# Patient Record
Sex: Male | Born: 1960 | Race: White | Hispanic: No | Marital: Single | Smoking: Former smoker
Health system: Southern US, Community
[De-identification: ages and names within clinical notes are randomized; demographics above are authoritative.]

## PROBLEM LIST (undated history)

## (undated) DIAGNOSIS — M439 Deforming dorsopathy, unspecified: Secondary | ICD-10-CM

## (undated) DIAGNOSIS — K7460 Unspecified cirrhosis of liver: Secondary | ICD-10-CM

## (undated) DIAGNOSIS — F32A Depression, unspecified: Secondary | ICD-10-CM

## (undated) DIAGNOSIS — E7800 Pure hypercholesterolemia, unspecified: Secondary | ICD-10-CM

## (undated) DIAGNOSIS — F329 Major depressive disorder, single episode, unspecified: Secondary | ICD-10-CM

---
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
## 2005-01-03 ENCOUNTER — Emergency Department: Payer: Self-pay | Admitting: Emergency Medicine

## 2006-08-30 ENCOUNTER — Emergency Department: Payer: Self-pay | Admitting: Emergency Medicine

## 2006-08-30 ENCOUNTER — Other Ambulatory Visit: Payer: Self-pay

## 2008-04-22 ENCOUNTER — Emergency Department: Payer: Self-pay | Admitting: Emergency Medicine

## 2009-05-29 ENCOUNTER — Emergency Department: Payer: Self-pay | Admitting: Emergency Medicine

## 2009-07-17 ENCOUNTER — Emergency Department: Payer: Self-pay | Admitting: Emergency Medicine

## 2011-06-03 ENCOUNTER — Emergency Department: Payer: Self-pay | Admitting: *Deleted

## 2012-09-14 LAB — URINALYSIS, COMPLETE
Bilirubin,UR: NEGATIVE
Ketone: NEGATIVE
Ph: 6 (ref 4.5–8.0)
Protein: NEGATIVE

## 2012-09-14 LAB — CK TOTAL AND CKMB (NOT AT ARMC)
CK, Total: 161 U/L (ref 35–232)
CK-MB: 10.2 ng/mL — ABNORMAL HIGH (ref 0.5–3.6)

## 2012-09-14 LAB — COMPREHENSIVE METABOLIC PANEL
Alkaline Phosphatase: 61 U/L (ref 50–136)
BUN: 10 mg/dL (ref 7–18)
Creatinine: 0.79 mg/dL (ref 0.60–1.30)
EGFR (African American): >60
EGFR (Non-African Amer.): >60
Glucose: 93 mg/dL (ref 65–99)
Osmolality: 278 (ref 275–301)
Potassium: 3.5 mmol/L (ref 3.5–5.1)
SGOT(AST): 60 U/L — ABNORMAL HIGH (ref 15–37)
SGPT (ALT): 82 U/L — ABNORMAL HIGH (ref 12–78)
Sodium: 140 mmol/L (ref 136–145)

## 2012-09-14 LAB — ETHANOL: Ethanol %: 0.098 % — ABNORMAL HIGH (ref 0.000–0.080)

## 2012-09-14 LAB — DRUG SCREEN, URINE
Amphetamines, Ur Screen: NEGATIVE (ref ?–1000)
Benzodiazepine, Ur Scrn: POSITIVE (ref ?–200)
MDMA (Ecstasy)Ur Screen: NEGATIVE (ref ?–500)
Methadone, Ur Screen: NEGATIVE (ref ?–300)
Opiate, Ur Screen: NEGATIVE (ref ?–300)
Phencyclidine (PCP) Ur S: NEGATIVE (ref ?–25)
Tricyclic, Ur Screen: POSITIVE (ref ?–1000)

## 2012-09-14 LAB — CBC WITH DIFFERENTIAL/PLATELET
Basophil #: 0.1 x10 3/mm 3 (ref 0.0–0.1)
Basophil %: 1 %
Eosinophil #: 0.1 x10 3/mm 3 (ref 0.0–0.7)
Eosinophil %: 2.6 %
HCT: 48.1 % (ref 40.0–52.0)
HGB: 16.4 g/dL (ref 13.0–18.0)
Lymphocyte %: 18.3 %
Lymphs Abs: 1 x10 3/mm 3 (ref 1.0–3.6)
MCH: 32 pg (ref 26.0–34.0)
MCHC: 34 g/dL (ref 32.0–36.0)
MCV: 94 fL (ref 80–100)
Monocyte #: 0.4 x10 3/mm (ref 0.2–1.0)
Monocyte %: 7.3 %
Neutrophil #: 4 x10 3/mm 3 (ref 1.4–6.5)
Neutrophil %: 70.8 %
Platelet: 161 x10 3/mm 3 (ref 150–440)
RBC: 5.11 x10 6/mm 3 (ref 4.40–5.90)
RDW: 13.8 % (ref 11.5–14.5)
WBC: 5.7 x10 3/mm 3 (ref 3.8–10.6)

## 2012-09-14 LAB — TROPONIN I: Troponin-I: 0.03 ng/mL

## 2012-09-14 LAB — PROTIME-INR: Prothrombin Time: 13.8 secs (ref 11.5–14.7)

## 2012-09-14 LAB — ACETAMINOPHEN LEVEL: Acetaminophen: <2 ug/mL

## 2012-09-14 LAB — APTT: Activated PTT: 28.9 s (ref 23.6–35.9)

## 2012-09-14 LAB — SALICYLATE LEVEL: Salicylates, Serum: 2.4 mg/dL

## 2012-09-15 ENCOUNTER — Observation Stay: Payer: Self-pay | Admitting: Psychiatry

## 2012-09-15 LAB — TROPONIN I: Troponin-I: <0.02 ng/mL

## 2012-09-16 ENCOUNTER — Inpatient Hospital Stay: Payer: Self-pay | Admitting: Psychiatry

## 2013-04-12 IMAGING — US US EXTREM LOW VENOUS*R*
1 series · 17 of 24 positions shown · non-contrast
Comparison: none

REASON FOR EXAM: pain/redness
COMMENTS:

[Series 1: us extrem low venous*right* · 17 of 26 slices shown]
[im 1/26]
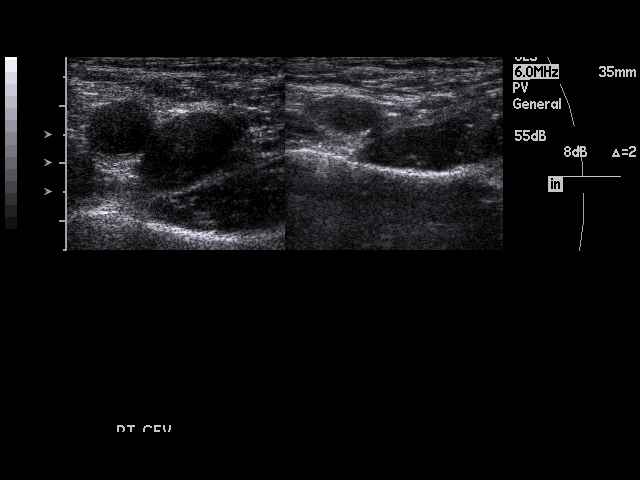
[im 3/26]
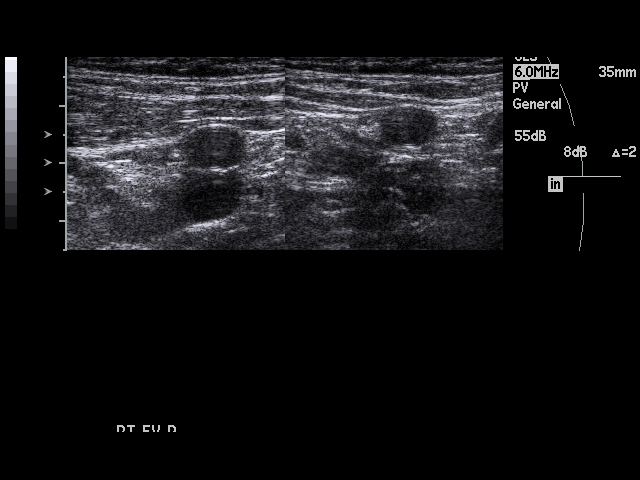
[im 4/26]
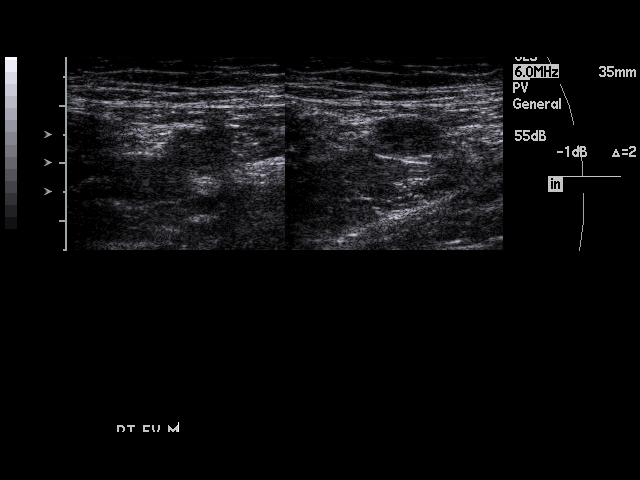
[im 5/26]
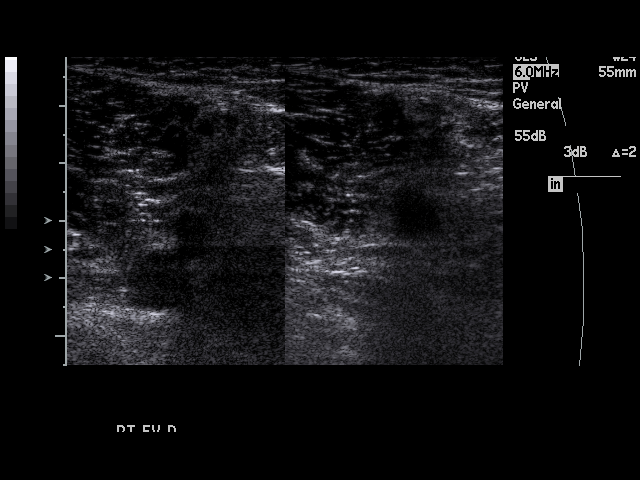
[im 7/26]
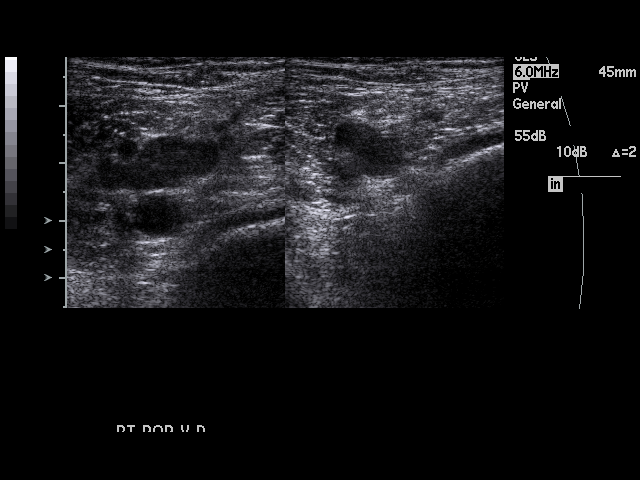
[im 8/26]
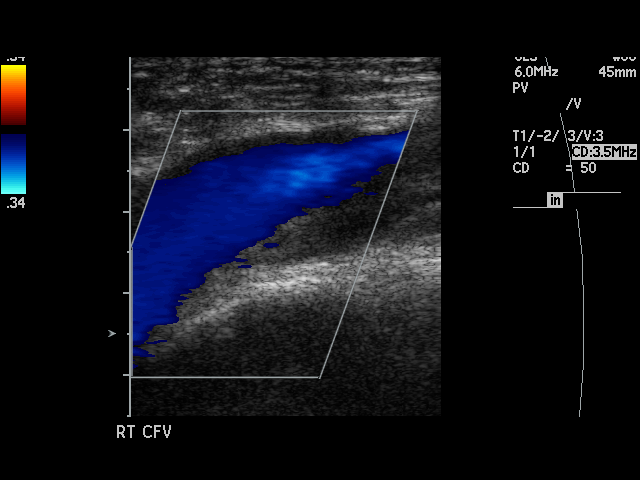
[im 10/26]
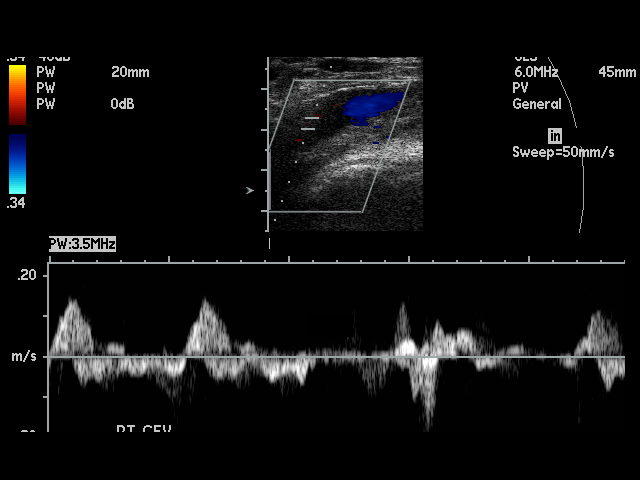
[im 11/26]
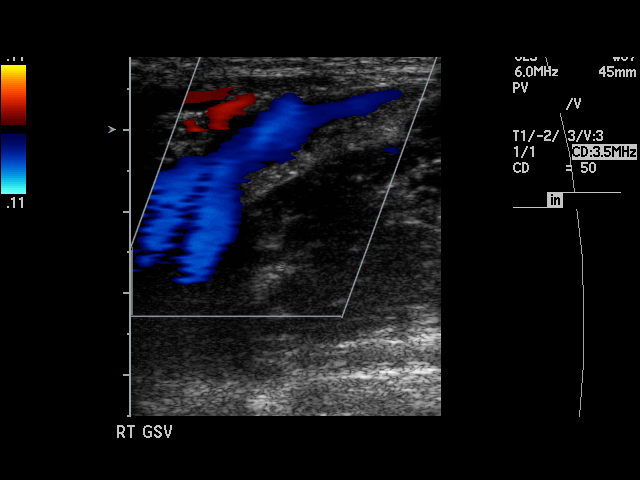
[im 14/26]
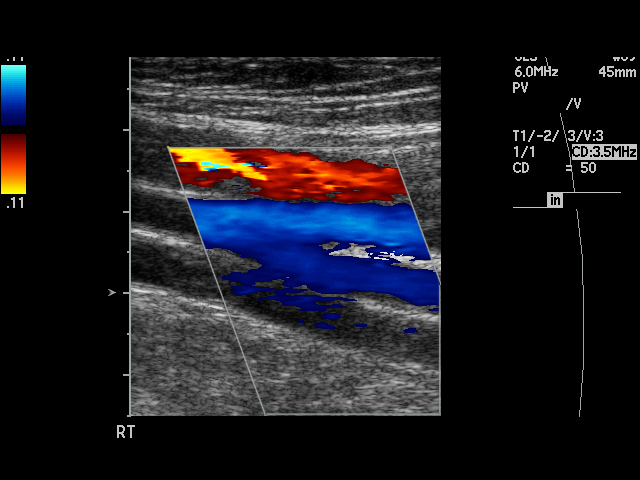
[im 15/26]
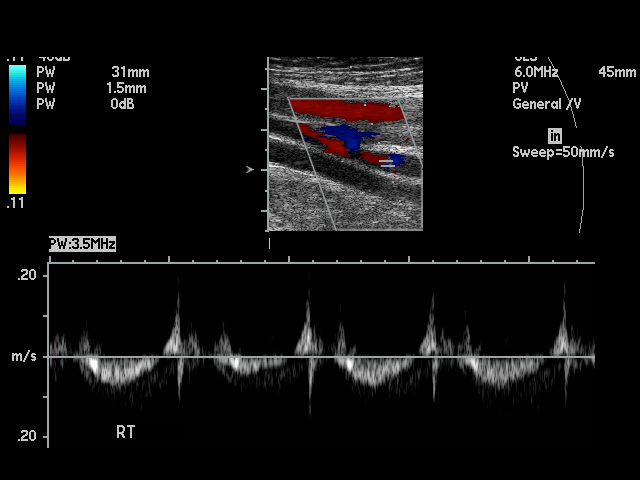
[im 16/26]
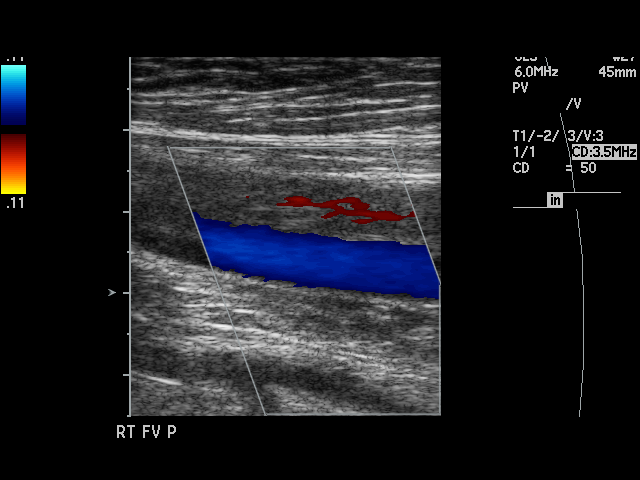
[im 18/26]
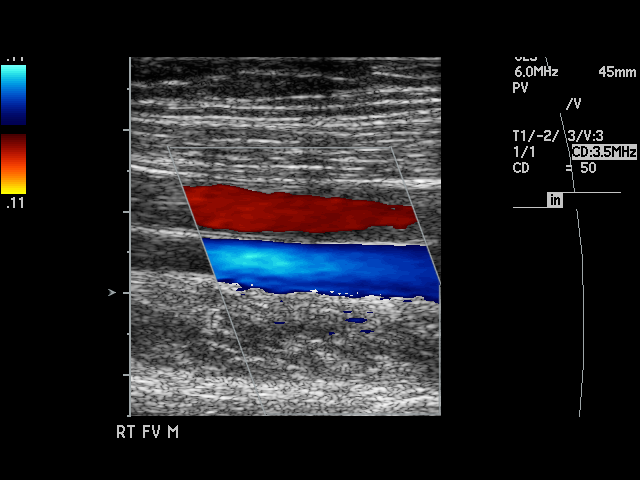
[im 19/26]
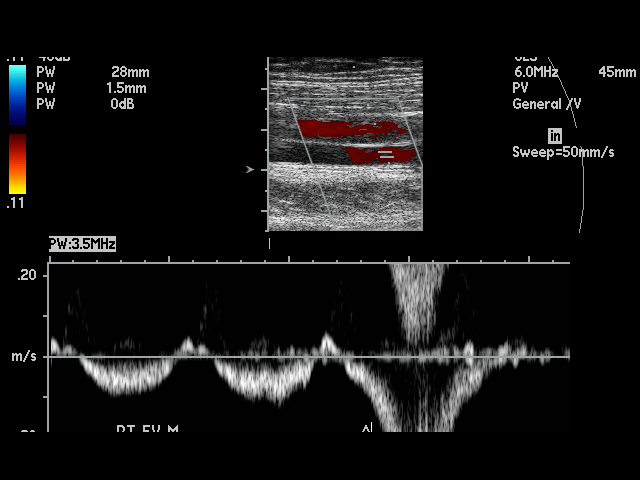
[im 21/26]
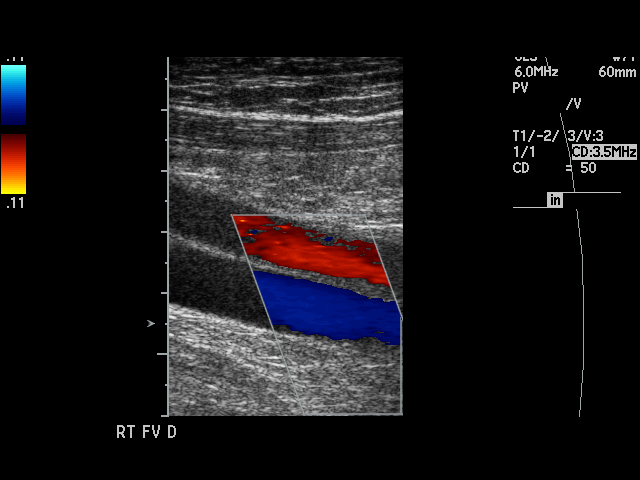
[im 22/26]
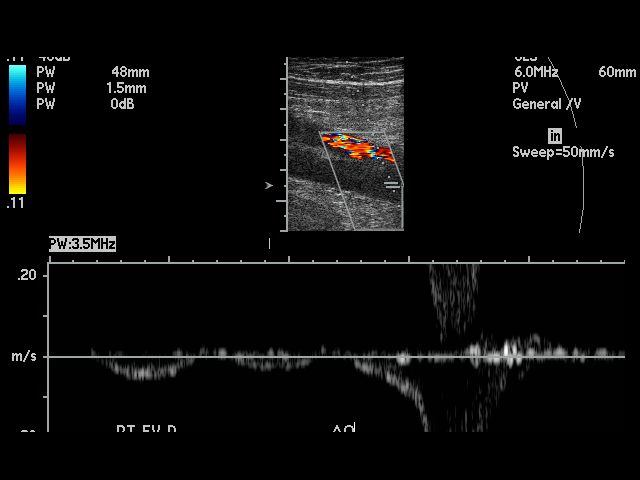
[im 23/26]
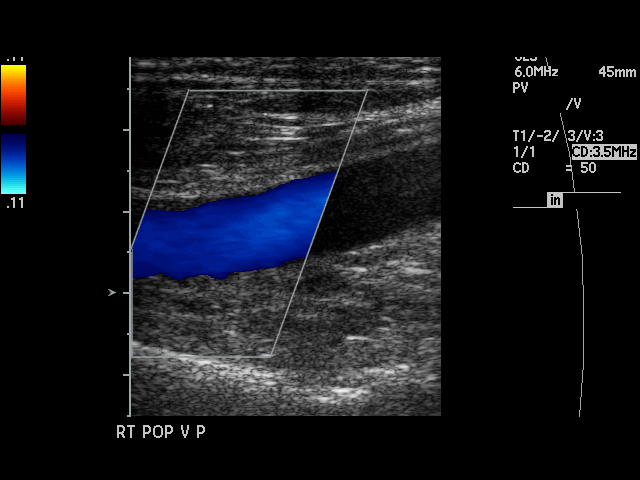
[im 26/26]
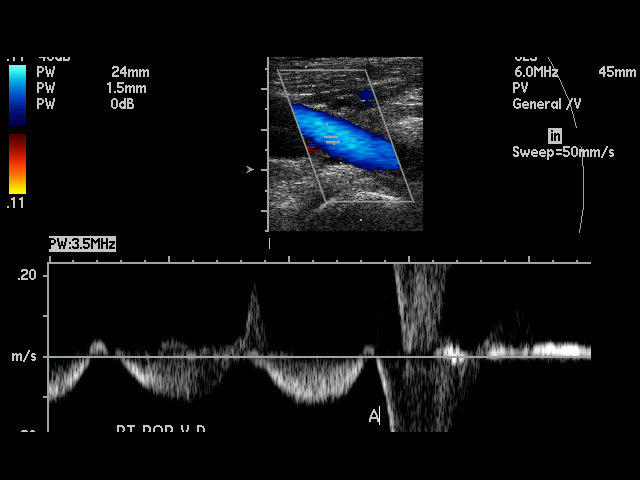

[17 of 24 positions shown; findings below may reference images not displayed]

PROCEDURE:     US  - US DOPPLER LOW EXTR RIGHT  - June 03, 2011  [DATE]

RESULT:     Technique: Gray scale, Duplex color flow and SPECTRAL waveform
imaging was performed of the deep venous structures of the right lower
extremity.

There is not evidence of increased echogenicity, non-compressibility,
abnormal waveform or abnormal grayscale flow with the interrogated deep
venous structures of the RIGHT lower extremity. There is appropriate
response to Valsalva and augmentation within the interrogated vessels.
IMPRESSION: 1. No sonographic evidence of a deep venous thrombus within the interrogated
vessels of the RIGHT lower extremity.
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
## 2015-02-14 NOTE — Discharge Summary (Signed)
PATIENT NAME:  Brett Hicks, Brett Hicks MR#:  [id number] DATE OF BIRTH:  1961/08/28  DATE OF ADMISSION:  09/16/2012 DATE OF DISCHARGE:  09/18/2012  HOSPITAL COURSE: See dictated history and physical for details of admission. This 54 year old man was admitted with a history of depression as well as recent heavy alcohol abuse, abuse of benzodiazepines and narcotics. Feeling out of control of his life and his behavior and his health. Requested help with detox. In the hospital, the patient was treated with the detox protocol, but remained physically stable. No sign of seizures or delirium tremens. He required minimal doses of benzodiazepines and his blood pressure and pulse have been stable. At the time of discharge, he is not showing any tremor and he is alert and appropriately interactive. His mood has improved significantly. He was started on fluoxetine 20 mg a day at the time of admission and has tolerated this well and is agreeable to staying on it as a longer term treatment for depression and anxiety. The patient was extremely interested in going to longer term rehab and seems to be sincerely invested in wanting to stop using. A referral was made to the alcohol and drug abuse treatment center and he has been accepted. Today he is being transferred [facility]. No change to his current medications. He is agreeable to the plan.   DISCHARGE MEDICATIONS: Prozac 20 mg Hicks.o. daily.   LABORATORY RESULTS: On admission acetaminophen non-detected, PTT normal at 28.9, and CK normal at 161. CBC entirely normal. Chemistry panel showed a chloride very slightly elevated at 109, CO2 slightly low at 20, calcium slightly low at 8.4, ALT elevated at 82, and AST elevated at 60. Alcohol is 98. PT was 13.8. Salicylates normal.  Blood group was A-positive.   Chest x-ray normal.   Urinalysis normal. Drug screen positive for cannabis and benzodiazepines.   EKG showed a  nonspecific intraventricular conduction delay, but was otherwise normal. No other significant findings.   MENTAL STATUS EXAM AT DISCHARGE: Casually dressed, neatly groomed man who looks his stated age. Cooperative and pleasant with the interview. Good eye contact. Normal psychomotor activity. Speech is normal in rate, tone, and volume. Affect euthymic, reactive, and appropriate. Mood stated as being good. Thoughts are lucid with no loosening of associations. Denies auditory or visual hallucinations. Denies suicidal or homicidal ideation. Shows good insight and judgment, normal intelligence, normal memory.   DISPOSITION: Discharge to alcohol and drug abuse treatment center.   DIAGNOSIS PRINCIPLE AND PRIMARY:   AXIS I: Alcohol dependence.   SECONDARY DIAGNOSES:   AXIS I:  1. 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  2. Depression, not otherwise specified.   AXIS II: Deferred.   AXIS III: No diagnosis.   AXIS IV: Moderate stress from being out of work, financial difficulty, and family difficulties.   AXIS V: Functioning at time of discharge 60. ____________________________ Audery AmelJohn T. Clapacs, MD jtc:slb D: 09/18/2012 12:40:27 ET     T: 09/18/2012 13:07:29 ET        JOB#: [id number] cc: Audery AmelJohn T. Clapacs, MD, <Dictator> Audery AmelJOHN T CLAPACS MD ELECTRONICALLY SIGNED 09/18/2012 13:29
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
## 2017-10-30 ENCOUNTER — Other Ambulatory Visit: Payer: Self-pay

## 2017-10-30 ENCOUNTER — Emergency Department
Admission: EM | Admit: 2017-10-30 | Discharge: 2017-10-30 | Disposition: A | Discharge status: Home / Self Care | Payer: Self-pay | Attending: Emergency Medicine | Admitting: Emergency Medicine

## 2017-10-30 DIAGNOSIS — Z87891 Personal history of nicotine dependence: Secondary | ICD-10-CM | POA: Insufficient documentation

## 2017-10-30 DIAGNOSIS — L03211 Cellulitis of face: Secondary | ICD-10-CM | POA: Insufficient documentation

## 2017-10-30 HISTORY — DX: Pure hypercholesterolemia, unspecified: E78.00

## 2017-10-30 HISTORY — DX: Unspecified cirrhosis of liver: K74.60

## 2017-10-30 MED ORDER — DIPHENHYDRAMINE HCL 25 MG PO CAPS
50.0000 mg | ORAL_CAPSULE | Freq: Once | ORAL | Status: AC
  Administered 2017-10-30: 50 mg via ORAL

## 2017-10-30 MED ORDER — DIPHENHYDRAMINE HCL 25 MG PO CAPS
ORAL_CAPSULE | ORAL | Status: AC
  Filled 2017-10-30: qty 2

## 2017-10-30 MED ORDER — SULFAMETHOXAZOLE-TRIMETHOPRIM 800-160 MG PO TABS: 1.0000 | ORAL_TABLET | Freq: Two times a day (BID) | ORAL | 0 refills | Status: AC

## 2017-10-30 MED ORDER — FAMOTIDINE 20 MG PO TABS
40.0000 mg | ORAL_TABLET | Freq: Once | ORAL | Status: AC
  Administered 2017-10-30: 40 mg via ORAL

## 2017-10-30 MED ORDER — FAMOTIDINE 20 MG PO TABS
ORAL_TABLET | ORAL | Status: AC
  Filled 2017-10-30: qty 2

## 2017-10-30 MED ORDER — SULFAMETHOXAZOLE-TRIMETHOPRIM 800-160 MG PO TABS
1.0000 | ORAL_TABLET | Freq: Once | ORAL | Status: AC
  Administered 2017-10-30: 1 via ORAL
  Filled 2017-10-30: qty 1

## 2017-10-30 NOTE — ED Triage Notes (Signed)
Pt states he woke up today with R sided facial swelling. Pt states his speech is different "like a little bit funny." speech sounds clear to this RN.  Pt states he can't blow his nose but denies congestion. Pt states he took sinus medicine this morning but doesn't know what it was. Pt is talking in complete sentences, no resp distress noted. Denies tongue or throat swelling.

## 2017-10-30 NOTE — ED Notes (Signed)
Per Dr. Don PerkingVeronese no blood work. PO medications ordered.

## 2017-10-30 NOTE — Discharge Instructions (Signed)
As we discussed please take your antibiotics as prescribed for the next 10 days twice daily.  Return to the emergency department for any significant increase in swelling, redness, pain or if you develop a fever.  Otherwise please follow-up with a primary care doctor in the next 2-3 days for recheck/reevaluation.

## 2017-10-30 NOTE — ED Provider Notes (Signed)
[facility] Emergency Department Provider Note  Time seen: 9:56 PM  I have reviewed the triage vital signs and the nursing notes.   HISTORY  Chief Complaint Facial Swelling    HPI Brett Hicks is a 57 y.o. male with hyperlipidemia presents to the emergency department for facial swelling and tenderness.  According to the patient since this morning he has had tenderness around his right nostril in around the surrounding face.  Mild erythema of this area as well.  No drainage.  No fever.  Patient came for evaluation.  Largely negative review of systems otherwise.   Past Medical History:  Diagnosis Date  . Cirrhosis of liver without ascites (HCC)   . High cholesterol     There are no active problems to display for this patient.   History reviewed. No pertinent surgical history.  Prior to Admission medications   Not on File    No Known Allergies  History reviewed. No pertinent family history.  Social History Social History   Tobacco Use  . Smoking status: Former Smoker  Substance Use Topics  . Alcohol use: No    Frequency: Never  . Drug use: Not on file    Review of Systems Constitutional: Negative for fever. Eyes: Negative for visual complaints. ENT: Swelling and tenderness to the right nostril. Cardiovascular: Negative for chest pain. Respiratory: Negative for shortness of breath. Gastrointestinal: Negative for abdominal pain, vomiting Genitourinary: No urinary complaints Musculoskeletal: Negative for back pain. Skin: Swelling, redness to right nostril and surrounding area. Neurological: Negative for headache All other ROS negative  ____________________________________________   PHYSICAL EXAM:  VITAL SIGNS: ED Triage Vitals  Enc Vitals Group     BP 10/30/17 1835 (!) 126/92     Pulse Rate 10/30/17 1835 70     Resp 10/30/17 1835 16     Temp 10/30/17 1835 98.6 F (37 C)     Temp Source 10/30/17 1835 Oral     SpO2 10/30/17 1835  97 %     Weight 10/30/17 1835 180 lb (81.6 kg)     Height 10/30/17 1835 5\' 10"  (1.778 m)     Head Circumference --      Peak Flow --      Pain Score 10/30/17 1840 7     Pain Loc --      Pain Edu? --      Excl. in GC? --     Constitutional: Alert and oriented. Well appearing and in no distress. Eyes: Normal exam ENT   Head: Normocephalic and atraumatic.   Nose: Patient does have moderate erythema with mild edema and moderate tenderness to the right nostril with a small pimple-like follicle, small amount of surrounding erythema approximately 1-2 cm outside of the nostril.  This area is largely nontender.   Mouth/Throat: Mucous membranes are moist. Cardiovascular: Normal rate, regular rhythm. No murmur Respiratory: Normal respiratory effort without tachypnea nor retractions. Breath sounds are clear Gastrointestinal: Soft and nontender. No distention.   Musculoskeletal: Nontender with normal range of motion in all extremities Neurologic:  Normal speech and language. No gross focal neurologic deficits Skin:  Skin is warm, dry.  Erythema with mild swelling to the right nostril as described above. Psychiatric: Mood and affect are normal. Speech and behavior are normal.   ____________________________________________   INITIAL IMPRESSION / ASSESSMENT AND PLAN / ED COURSE  Pertinent labs & imaging results that were available during my care of the patient were reviewed by me and considered in  my medical decision making (see chart for details).  Presents to the emergency department with redness and swelling to the right nostril.  Differential would include cellulitis, facial cellulitis, folliculitis.  Overall the patient appears well, no distress with an overall normal exam besides mild amount of swelling nostril.  Patient does have a small inflamed follicle, I was able to unroofed with mild amount of drainage.  Exam is most consistent with a mild cellulitis.  No sign of any other  abscess at this time.  Will place patient on Bactrim twice daily for the next 10 days.  I discussed with the patient very strict return precautions for any spreading of the redness, swelling or development of fever.  Patient agreeable to this plan of care.  ____________________________________________   FINAL CLINICAL IMPRESSION(S) / ED DIAGNOSES  Cellulitis    Minna AntisPaduchowski, Bearl, MD 10/30/17 2159

## 2019-09-17 ENCOUNTER — Emergency Department: Payer: Self-pay

## 2019-09-17 ENCOUNTER — Encounter: Payer: Self-pay | Admitting: Intensive Care

## 2019-09-17 ENCOUNTER — Emergency Department
Admission: EM | Admit: 2019-09-17 | Discharge: 2019-09-17 | Disposition: A | Discharge status: Home / Self Care | Payer: Self-pay | Attending: Emergency Medicine | Admitting: Emergency Medicine

## 2019-09-17 ENCOUNTER — Other Ambulatory Visit: Payer: Self-pay

## 2019-09-17 DIAGNOSIS — Y92410 Unspecified street and highway as the place of occurrence of the external cause: Secondary | ICD-10-CM | POA: Diagnosis not present

## 2019-09-17 DIAGNOSIS — Y93I9 Activity, other involving external motion: Secondary | ICD-10-CM | POA: Diagnosis not present

## 2019-09-17 DIAGNOSIS — Y999 Unspecified external cause status: Secondary | ICD-10-CM | POA: Diagnosis not present

## 2019-09-17 DIAGNOSIS — S299XXA Unspecified injury of thorax, initial encounter: Secondary | ICD-10-CM | POA: Diagnosis present

## 2019-09-17 DIAGNOSIS — Z87891 Personal history of nicotine dependence: Secondary | ICD-10-CM | POA: Insufficient documentation

## 2019-09-17 DIAGNOSIS — Z79899 Other long term (current) drug therapy: Secondary | ICD-10-CM | POA: Diagnosis not present

## 2019-09-17 DIAGNOSIS — S2242XA Multiple fractures of ribs, left side, initial encounter for closed fracture: Secondary | ICD-10-CM | POA: Diagnosis not present

## 2019-09-17 HISTORY — DX: Deforming dorsopathy, unspecified: M43.9

## 2019-09-17 HISTORY — DX: Depression, unspecified: F32.A

## 2019-09-17 MED ORDER — ONDANSETRON 4 MG PO TBDP
4.0000 mg | ORAL_TABLET | Freq: Once | ORAL | Status: AC
  Administered 2019-09-17: 4 mg via ORAL
  Filled 2019-09-17: qty 1

## 2019-09-17 MED ORDER — OXYCODONE-ACETAMINOPHEN 5-325 MG PO TABS
1.0000 | ORAL_TABLET | Freq: Once | ORAL | Status: AC
  Administered 2019-09-17: 1 via ORAL
  Filled 2019-09-17: qty 1

## 2019-09-17 MED ORDER — ONDANSETRON HCL 4 MG PO TABS: 4.0000 mg | ORAL_TABLET | Freq: Three times a day (TID) | ORAL | 0 refills | Status: AC | PRN

## 2019-09-17 MED ORDER — OXYCODONE-ACETAMINOPHEN 5-325 MG PO TABS: 1.0000 | ORAL_TABLET | Freq: Four times a day (QID) | ORAL | 0 refills | Status: AC | PRN
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
## 2021-10-30 ENCOUNTER — Telehealth: Payer: Self-pay | Admitting: Licensed Clinical Social Worker
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
